# Patient Record
Sex: Female | Born: 1985 | Race: White | Hispanic: No | Marital: Married
Health system: Midwestern US, Community
[De-identification: ages and names within clinical notes are randomized; demographics above are authoritative.]

---
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
## 2007-08-20 ENCOUNTER — Emergency Department (HOSPITAL_COMMUNITY): Admission: EM | Admit: 2007-08-20 | Discharge: 2007-08-20 | Payer: Self-pay | Admitting: Emergency Medicine

## 2009-06-18 IMAGING — CT CT HEAD W/O CM
1 series · 16 of 30 positions shown, 20 images · IV contrast (agent unspecified)
Comparison: None.

CLINICAL DATA: 21-year-old female with lightheadedness and nausea.  Fall. 
 HEAD CT WITHOUT CONTRAST:
TECHNIQUE: Contiguous axial images were obtained from the base of the skull through the vertex according to standard protocol without contrast.

[Series 2: head_seq 4.5 h37s st · axial · 0.43mm/px · z∈[-138,-12]mm · 16 of 32 slices shown, 20 images]
[im 2/32  brain]
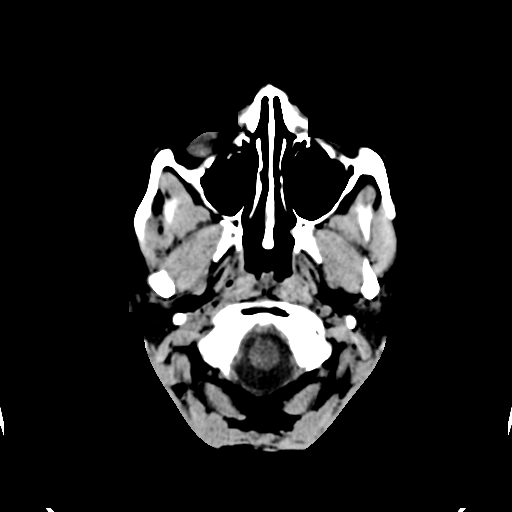
[im 2/32  bone]
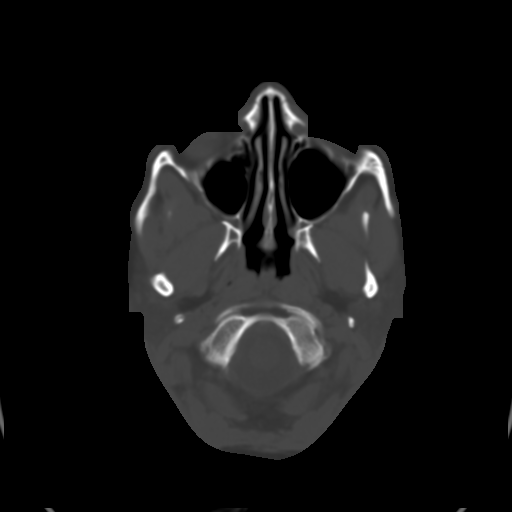
[im 4/32  brain]
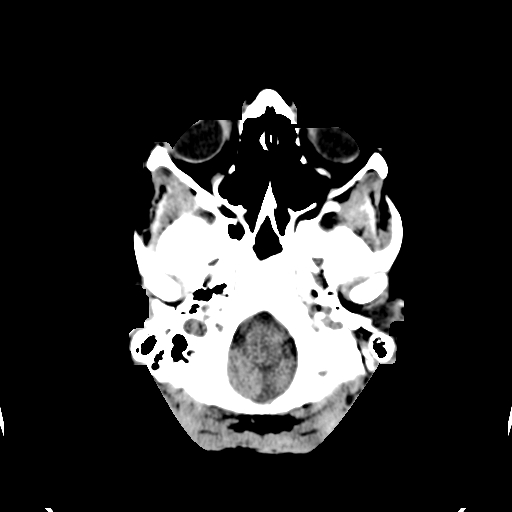
[im 6/32  brain]
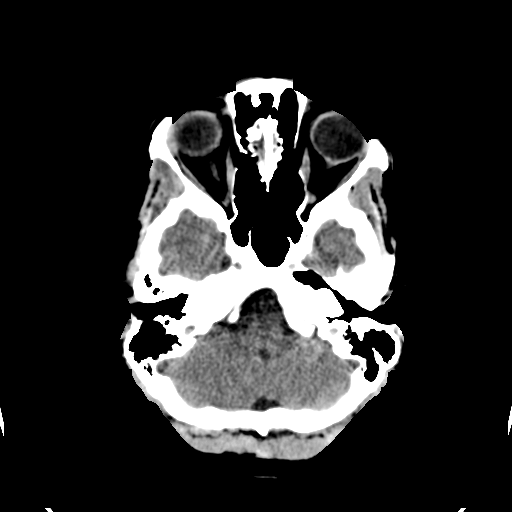
[im 8/32  brain]
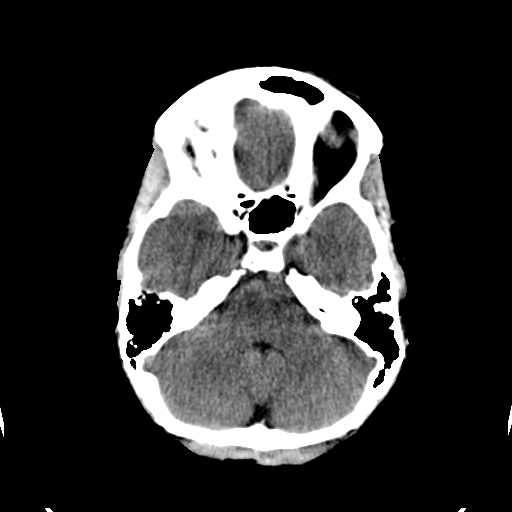
[im 9/32  brain]
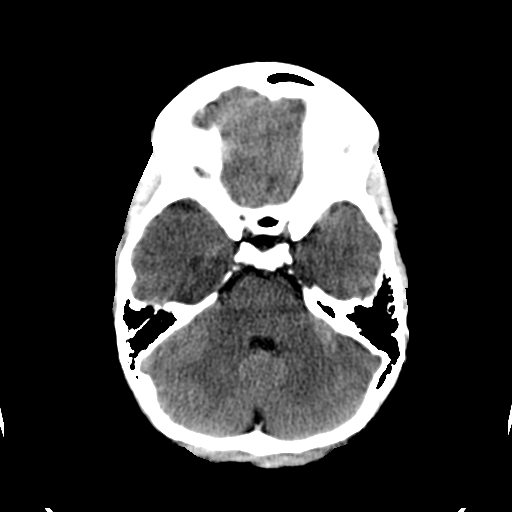
[im 9/32  bone]
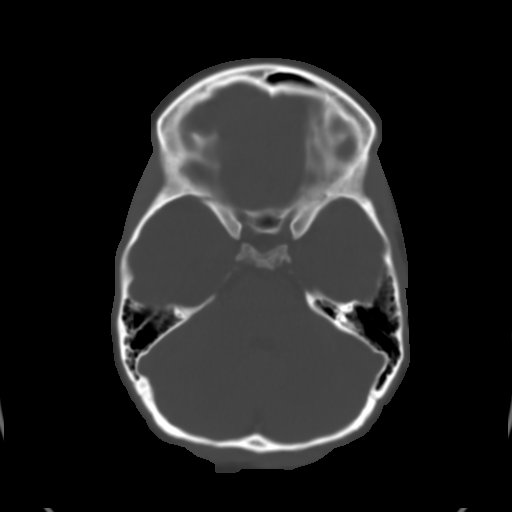
[im 11/32  brain]
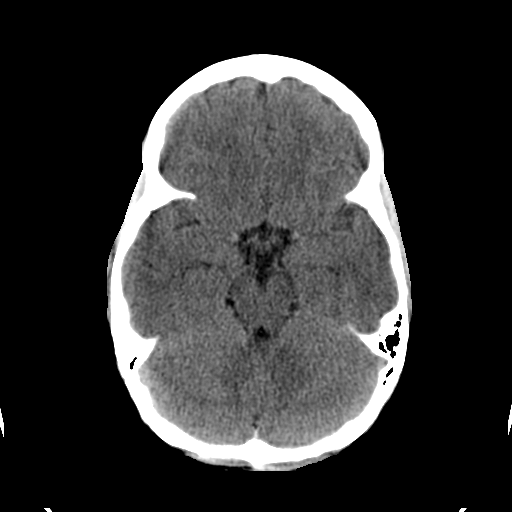
[im 13/32  brain]
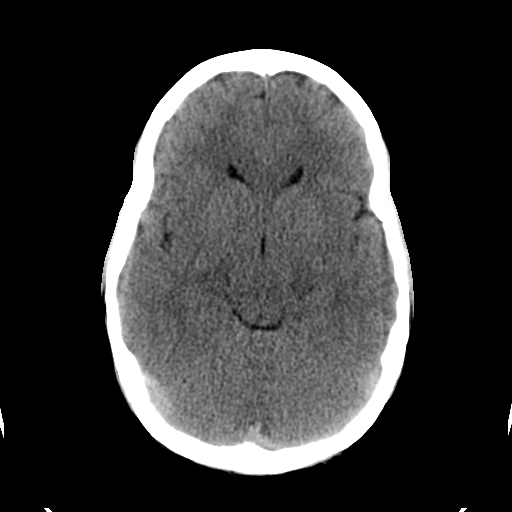
[im 15/32  brain]
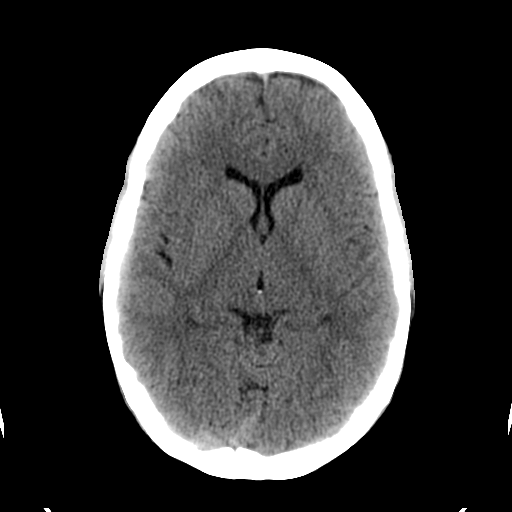
[im 17/32  brain]
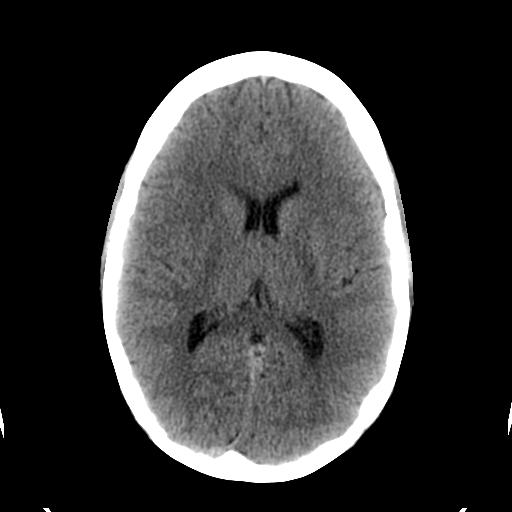
[im 17/32  bone]
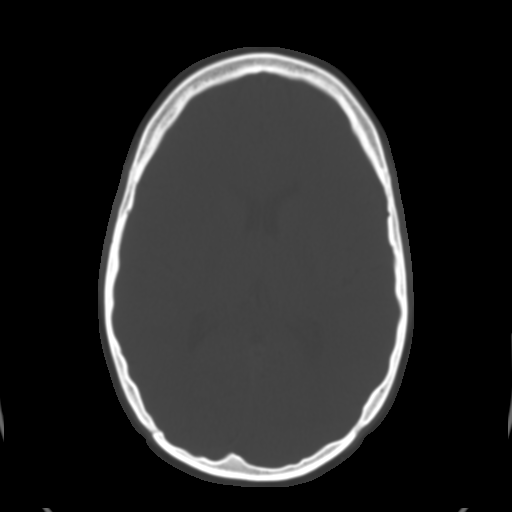
[im 19/32  brain]
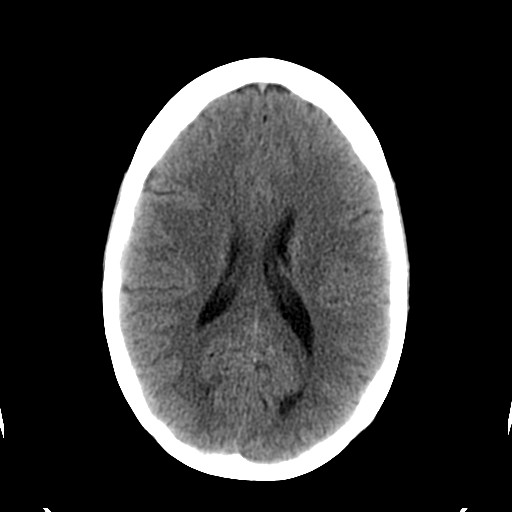
[im 21/32  brain]
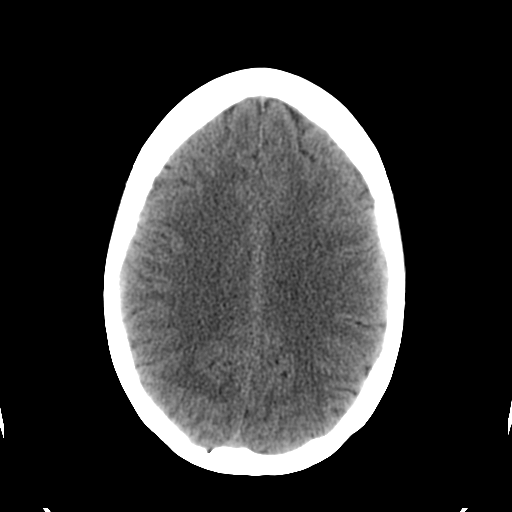
[im 23/32  brain]
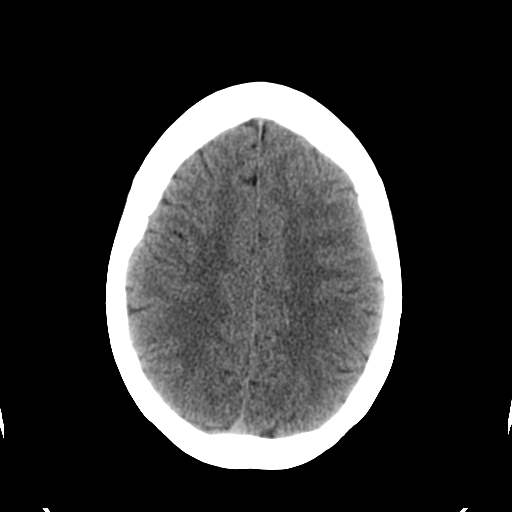
[im 24/32  brain]
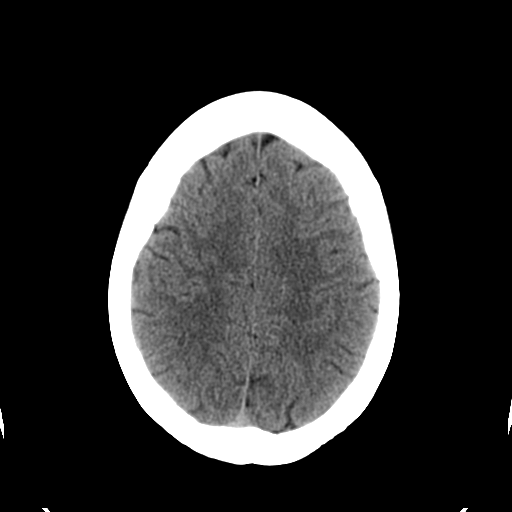
[im 24/32  bone]
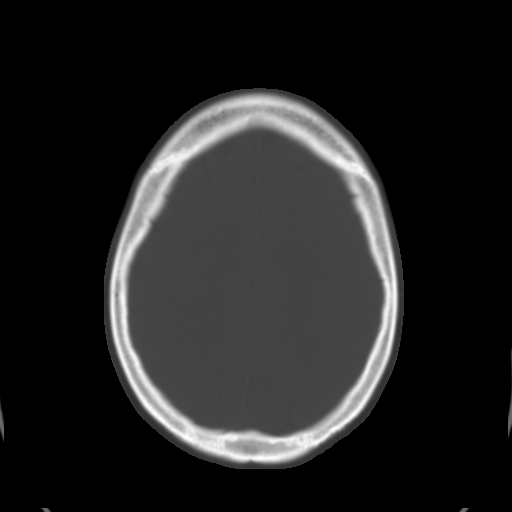
[im 26/32  brain]
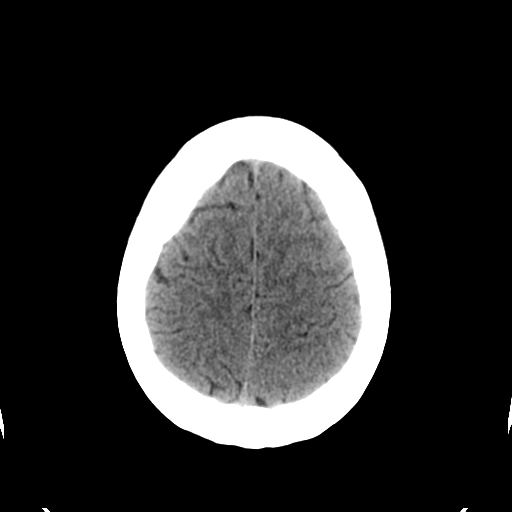
[im 28/32  brain]
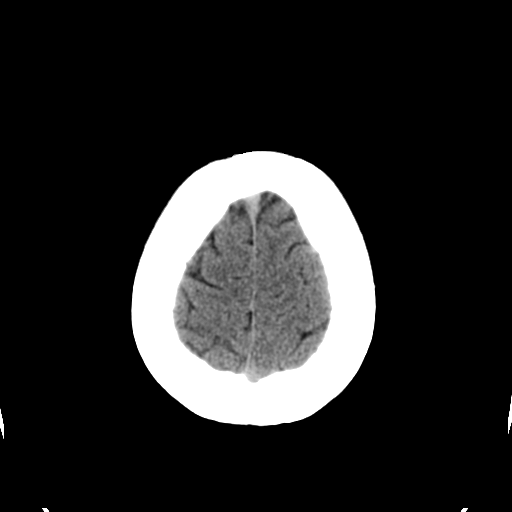
[im 30/32  brain]
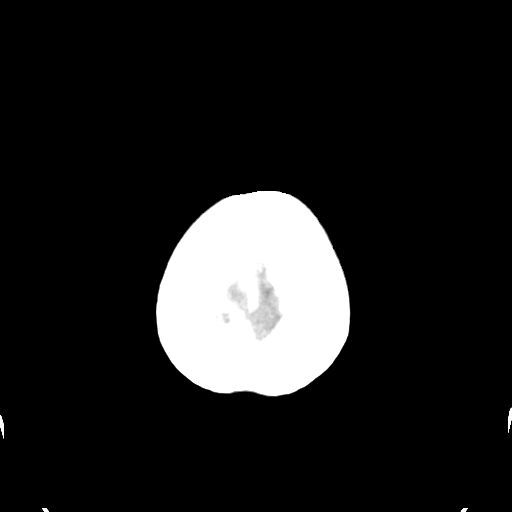

[16 of 30 positions shown; findings below may reference images not displayed]

FINDINGS: There is no evidence of intracranial hemorrhage, brain edema, or mass effect.  No other intra-axial abnormalities are seen, and the ventricles are within normal limits.  No abnormal extra-axial fluid collections or masses are identified.  No skull abnormalities are noted.
IMPRESSION: Negative non-contrast head CT.

## 2011-06-08 LAB — BASIC METABOLIC PANEL
Calcium: 9.5
Creatinine, Ser: 0.78
GFR calc non Af Amer: >60
Glucose, Bld: 104 — ABNORMAL HIGH
Potassium: 4.6
Sodium: 143

## 2011-06-08 LAB — CBC
MCHC: 35.1
RBC: 4.62
RDW: 12.5
WBC: 9.7

## 2011-06-08 LAB — RAPID URINE DRUG SCREEN, HOSP PERFORMED
Cocaine: NOT DETECTED
Opiates: NOT DETECTED

## 2011-06-08 LAB — POCT PREGNANCY, URINE: Operator id: 30260

## 2018-03-25 ENCOUNTER — Encounter (HOSPITAL_COMMUNITY): Payer: Self-pay | Admitting: Emergency Medicine

## 2018-03-25 ENCOUNTER — Emergency Department (HOSPITAL_COMMUNITY)
Admission: EM | Admit: 2018-03-25 | Discharge: 2018-03-26 | Disposition: A | Discharge status: Home / Self Care | Payer: 59 | Attending: Emergency Medicine | Admitting: Emergency Medicine

## 2018-03-25 DIAGNOSIS — Z91040 Latex allergy status: Secondary | ICD-10-CM | POA: Insufficient documentation

## 2018-03-25 DIAGNOSIS — R102 Pelvic and perineal pain: Secondary | ICD-10-CM | POA: Insufficient documentation

## 2018-03-25 LAB — URINALYSIS, ROUTINE W REFLEX MICROSCOPIC
BILIRUBIN URINE: NEGATIVE
Glucose, UA: NEGATIVE mg/dL
Hgb urine dipstick: NEGATIVE
KETONES UR: NEGATIVE mg/dL
LEUKOCYTES UA: NEGATIVE
NITRITE: NEGATIVE
PROTEIN: NEGATIVE mg/dL
Specific Gravity, Urine: 1.002 — ABNORMAL LOW (ref 1.005–1.030)
pH: 7 (ref 5.0–8.0)

## 2018-03-25 LAB — I-STAT BETA HCG BLOOD, ED (MC, WL, AP ONLY)

## 2018-03-25 LAB — CBC WITH DIFFERENTIAL/PLATELET
ABS IMMATURE GRANULOCYTES: 0 10*3/uL (ref 0.0–0.1)
Basophils Absolute: 0.1 10*3/uL (ref 0.0–0.1)
Basophils Relative: 1 %
EOS ABS: 0.3 10*3/uL (ref 0.0–0.7)
Eosinophils Relative: 4 %
HEMATOCRIT: 42.3 % (ref 36.0–46.0)
Hemoglobin: 13.9 g/dL (ref 12.0–15.0)
Immature Granulocytes: 0 %
LYMPHS ABS: 1.4 10*3/uL (ref 0.7–4.0)
LYMPHS PCT: 19 %
MCH: 28.1 pg (ref 26.0–34.0)
MCHC: 32.9 g/dL (ref 30.0–36.0)
MCV: 85.6 fL (ref 78.0–100.0)
MONOS PCT: 8 %
Monocytes Absolute: 0.6 10*3/uL (ref 0.1–1.0)
NEUTROS ABS: 5 10*3/uL (ref 1.7–7.7)
NEUTROS PCT: 68 %
Platelets: 200 10*3/uL (ref 150–400)
RBC: 4.94 MIL/uL (ref 3.87–5.11)
RDW: 12.1 % (ref 11.5–15.5)
WBC: 7.3 10*3/uL (ref 4.0–10.5)

## 2018-03-25 LAB — WET PREP, GENITAL
Clue Cells Wet Prep HPF POC: NONE SEEN
Sperm: NONE SEEN
Trich, Wet Prep: NONE SEEN
YEAST WET PREP: NONE SEEN

## 2018-03-25 LAB — COMPREHENSIVE METABOLIC PANEL
ALBUMIN: 4.6 g/dL (ref 3.5–5.0)
ALT: 20 U/L (ref 0–44)
AST: 21 U/L (ref 15–41)
Alkaline Phosphatase: 71 U/L (ref 38–126)
Anion gap: 12 (ref 5–15)
BILIRUBIN TOTAL: 0.8 mg/dL (ref 0.3–1.2)
BUN: 12 mg/dL (ref 6–20)
CALCIUM: 9.8 mg/dL (ref 8.9–10.3)
CO2: 26 mmol/L (ref 22–32)
Chloride: 104 mmol/L (ref 98–111)
Creatinine, Ser: 0.89 mg/dL (ref 0.44–1.00)
GFR calc Af Amer: >60 mL/min (ref >60)
GFR calc non Af Amer: >60 mL/min (ref >60)
Glucose, Bld: 114 mg/dL — ABNORMAL HIGH (ref 70–99)
Potassium: 4 mmol/L (ref 3.5–5.1)
Sodium: 142 mmol/L (ref 135–145)
Total Protein: 7.8 g/dL (ref 6.5–8.1)

## 2018-03-25 NOTE — ED Triage Notes (Signed)
Formatting of this note might be different from the original.  Pt presents with RLQ abd pain that began today at 3p and subsided and returned at 6p but worse; pt reports the pain radiates down into the R groin; LMP yesterday; pt denies urinary changes or vaginal discharge  Electronically signed by Lennie OdorWallace, Sara M, RN at 03/25/2018  7:31 PM EDT

## 2018-03-25 NOTE — ED Provider Notes (Signed)
Formatting of this note might be different from the original.  Patient placed in Quick Look pathway, seen and evaluated     Chief Complaint: right lower abd pain    HPI:   Pt on menses.  Severe pain rlq sudden onset.     ROS: no fever, no nausea    Physical Exam:    Gen: No distress   Neuro: Awake and Alert   Skin: Warm     Focused Exam: tender right lower quadrant    Initiation of care has begun. The patient has been counseled on the process, plan, and necessity for staying for the completion/evaluation, and the remainder of the medical screening examination    Osie CheeksSofia, Leslie K, PA-C  03/25/18 1938      Tegeler, Canary Brimhristopher J, MD  03/26/18 [phone]    Electronically signed by Tegeler, Canary Brimhristopher J, MD at 03/26/2018  1:19 AM EDT    Associated attestation - Tegeler, Canary Brimhristopher J, MD - 03/26/2018  1:19 AM EDT  Formatting of this note might be different from the original.  Attestation: Medical screening examination/treatment/procedure(s) were performed by non-physician practitioner and as supervising physician I was immediately available for consultation/collaboration.    EKG: None

## 2018-03-25 NOTE — ED Provider Notes (Signed)
Formatting of this note is different from the original.  Images from the original note were not included.    [facility]  Provider Note    CSN: [id number]  Arrival date & time: 03/25/18  1900    History    Chief Complaint  Chief Complaint   Patient presents with   ? Abdominal Pain     HPI  Amanda Mcknight is a 32 y.o. female.    The history is provided by the patient and medical records.   Abdominal Pain          32 year old female with history of cervical dysplasia with mild precancerous changes, presenting to the ED for right-sided pelvic pain.  Reports this began around 3:00 today lasted for about an hour and then resolved.  States she change her clothes and was actually going to work out around 6 PM when pain returned and was more severe than previous.  Pain localized to right pelvis/groin without significant radiation.  She denies any urinary symptoms, vaginal discharge.  No N/V/D.  She is currently on her menstrual cycle, normal period.  She has not had any new sexual partner, no clinical concern for STD.  She does follow-up with her OB/GYN every 6 months, reports her prior precancerous changes were on her left side.  She has no history of ovarian cyst, uterine fibroids, endometriosis, or other GYN pathology.    History reviewed. No pertinent past medical history.    There are no active problems to display for this patient.    History reviewed. No pertinent surgical history.     OB History    None      Home Medications      Prior to Admission medications    Not on File     Family History  History reviewed. No pertinent family history.    Social History  Social History     Tobacco Use   ? Smoking status: Never Smoker   Substance Use Topics   ? Alcohol use: Not Currently   ? Drug use: Not Currently     Allergies    Amoxicillin and Latex    Review of Systems  Review of Systems   Gastrointestinal: Positive for abdominal pain.   Genitourinary: Positive for pelvic pain.   All  other systems reviewed and are negative.    Physical Exam  Updated Vital Signs  BP (!) 122/91 (BP Location: Left Arm)   Pulse 76   Temp 97.8 F (36.6 C)   Resp 16   Ht 5\' 4"  (1.626 m)   Wt 72.6 kg (160 lb)   LMP 03/24/2018   SpO2 99%   BMI 27.46 kg/m     Physical Exam   Constitutional: She is oriented to person, place, and time. She appears well-developed and well-nourished.   HENT:   Head: Normocephalic and atraumatic.   Mouth/Throat: Oropharynx is clear and moist.   Eyes: Pupils are equal, round, and reactive to light. Conjunctivae and EOM are normal.   Neck: Normal range of motion.   Cardiovascular: Normal rate, regular rhythm and normal heart sounds.   Pulmonary/Chest: Effort normal and breath sounds normal.   Abdominal: Soft. Bowel sounds are normal. There is tenderness. There is no rigidity, no guarding and no tenderness at McBurney's point.     Genitourinary:   Genitourinary Comments: Exam chaperoned by RN  Normal female external genitalia without visible lesions/rash; very small amount of vaginal bleeding without appreciable discharge; right adnexal  tenderness; left non-tender; no CMT   Musculoskeletal: Normal range of motion.   Neurological: She is alert and oriented to person, place, and time.   Skin: Skin is warm and dry.   Psychiatric: She has a normal mood and affect.   Nursing note and vitals reviewed.    ED Treatments / Results   Labs  (all labs ordered are listed, but only abnormal results are displayed)  Labs Reviewed   WET PREP, GENITAL - Abnormal; Notable for the following components:       Result Value    WBC, Wet Prep HPF POC RARE (*)     All other components within normal limits   COMPREHENSIVE METABOLIC PANEL - Abnormal; Notable for the following components:    Glucose, Bld 114 (*)     All other components within normal limits   URINALYSIS, ROUTINE W REFLEX MICROSCOPIC - Abnormal; Notable for the following components:    Color, Urine COLORLESS (*)     Specific Gravity, Urine 1.002  (*)     All other components within normal limits   CBC WITH DIFFERENTIAL/PLATELET   I-STAT BETA HCG BLOOD, ED ([facility], [facility], [facility] ONLY)   GC/CHLAMYDIA PROBE AMP ([facility]) NOT AT [facility]     EKG  None    Radiology  US Pelvis Transvanginal Non-ob (tv Only)    Result Date: 03/26/2018  CLINICAL DATA:  Right-sided pelvic pain since 3 p.m., worsening. EXAM: TRANSABDOMINAL AND TRANSVAGINAL ULTRASOUND OF PELVIS DOPPLER ULTRASOUND OF OVARIES TECHNIQUE: Both transabdominal and transvaginal ultrasound examinations of the pelvis were performed. Transabdominal technique was performed for global imaging of the pelvis including uterus, ovaries, adnexal regions, and pelvic cul-de-sac. It was necessary to proceed with endovaginal exam following the transabdominal exam to visualize the ovaries and endometrium. Color and duplex Doppler ultrasound was utilized to evaluate blood flow to the ovaries. COMPARISON:  None. FINDINGS: Uterus Measurements: 6.5 x 3.6 x 4.9 cm. Uterus is retroverted. Small nabothian cysts in the cervix. No fibroids or other mass visualized. Endometrium Thickness: 5 mm.  No focal abnormality visualized. Right ovary Measurements: 3.3 x 1.8 x 2.4 cm. Normal appearance/no adnexal mass. Left ovary Measurements: 2.6 x 1.2 x 1.8 cm. Normal appearance/no adnexal mass. Pulsed Doppler evaluation of both ovaries demonstrates normal low-resistance arterial and venous waveforms. Other findings No abnormal free fluid. IMPRESSION: Normal ultrasound appearance of the uterus and ovaries. Electronically Signed   By: Burman Nieves M.D.   On: 03/26/2018 01:53     US Pelvis Complete    Result Date: 03/26/2018  CLINICAL DATA:  Right-sided pelvic pain since 3 p.m., worsening. EXAM: TRANSABDOMINAL AND TRANSVAGINAL ULTRASOUND OF PELVIS DOPPLER ULTRASOUND OF OVARIES TECHNIQUE: Both transabdominal and transvaginal ultrasound examinations of the pelvis were performed. Transabdominal technique was performed for global imaging of the pelvis  including uterus, ovaries, adnexal regions, and pelvic cul-de-sac. It was necessary to proceed with endovaginal exam following the transabdominal exam to visualize the ovaries and endometrium. Color and duplex Doppler ultrasound was utilized to evaluate blood flow to the ovaries. COMPARISON:  None. FINDINGS: Uterus Measurements: 6.5 x 3.6 x 4.9 cm. Uterus is retroverted. Small nabothian cysts in the cervix. No fibroids or other mass visualized. Endometrium Thickness: 5 mm.  No focal abnormality visualized. Right ovary Measurements: 3.3 x 1.8 x 2.4 cm. Normal appearance/no adnexal mass. Left ovary Measurements: 2.6 x 1.2 x 1.8 cm. Normal appearance/no adnexal mass. Pulsed Doppler evaluation of both ovaries demonstrates normal low-resistance arterial and venous waveforms. Other findings No abnormal  free fluid. IMPRESSION: Normal ultrasound appearance of the uterus and ovaries. Electronically Signed   By: Burman Nieves M.D.   On: 03/26/2018 01:53     US Pelvic Doppler (torsion R/o Or Mass Arterial Flow)    Result Date: 03/26/2018  CLINICAL DATA:  Right-sided pelvic pain since 3 p.m., worsening. EXAM: TRANSABDOMINAL AND TRANSVAGINAL ULTRASOUND OF PELVIS DOPPLER ULTRASOUND OF OVARIES TECHNIQUE: Both transabdominal and transvaginal ultrasound examinations of the pelvis were performed. Transabdominal technique was performed for global imaging of the pelvis including uterus, ovaries, adnexal regions, and pelvic cul-de-sac. It was necessary to proceed with endovaginal exam following the transabdominal exam to visualize the ovaries and endometrium. Color and duplex Doppler ultrasound was utilized to evaluate blood flow to the ovaries. COMPARISON:  None. FINDINGS: Uterus Measurements: 6.5 x 3.6 x 4.9 cm. Uterus is retroverted. Small nabothian cysts in the cervix. No fibroids or other mass visualized. Endometrium Thickness: 5 mm.  No focal abnormality visualized. Right ovary Measurements: 3.3 x 1.8 x 2.4 cm. Normal  appearance/no adnexal mass. Left ovary Measurements: 2.6 x 1.2 x 1.8 cm. Normal appearance/no adnexal mass. Pulsed Doppler evaluation of both ovaries demonstrates normal low-resistance arterial and venous waveforms. Other findings No abnormal free fluid. IMPRESSION: Normal ultrasound appearance of the uterus and ovaries. Electronically Signed   By: Burman Nieves M.D.   On: 03/26/2018 01:53     Procedures  Procedures (including critical care time)    Medications Ordered in ED  Medications - No data to display    Initial Impression / Assessment and Plan / ED Course   I have reviewed the triage vital signs and the nursing notes.    Pertinent labs & imaging results that were available during my care of the patient were reviewed by me and considered in my medical decision making (see chart for details).    32 year old female presenting to the ED with right-sided pelvic pain.  Reports this began around 3 PM, resolved but returned again around 6 PM.  She denies any associated nausea, vomiting, diarrhea, or fever.  No urinary symptoms, vaginal discharge, new sexual partner, or concern for STD.  Does have history of cervical dysplasia with precancerous changes.  Labs overall reassuring.  UA without any signs of infection.  Pelvic exam performed, moderate amount of vaginal bleeding as patient is currently on her menstrual.  There is no appreciable discharge.  Does have some right adnexal tenderness, exam otherwise benign.  Ultrasound obtained, no acute findings.  Wet prep negative.  Gc/chl pending. Uncertain etiology of patient's pain.  Remains localized to the right side of pelvis only, do not feel that this represents appendicitis.  Will have her follow-up closely with her GYN.  Will provide pain control.  She understands to return here for any new or worsening symptoms.    Final Clinical Impressions(s) / ED Diagnoses     Final diagnoses:   Pelvic pain     ED Discharge Orders         Ordered     ibuprofen (ADVIL,MOTRIN)  800 MG tablet  3 times daily      03/26/18 0247     HYDROcodone-acetaminophen (NORCO/VICODIN) 5-325 MG tablet  Every 4 hours PRN      03/26/18 0247         Garlon Hatchet, PA-C  03/26/18 0309      Wilkie Aye, Mayer Masker, MD  03/26/18 [phone]    Electronically signed by Shon Baton, MD at 03/26/2018  7:14 AM EDT  Associated attestation - Shon Baton, MD - 03/26/2018  7:14 AM EDT  Formatting of this note might be different from the original.  Attestation: Medical screening examination/treatment/procedure(s) were performed by non-physician practitioner and as supervising physician I was immediately available for consultation/collaboration.    EKG: None

## 2018-03-25 NOTE — ED Provider Notes (Signed)
[facility] Provider [facility]   CSN: [id number] Arrival date & time: 03/25/18  1900     History   Chief Complaint Chief Complaint  Patient presents with  . Abdominal Pain    HPI Joan Barrett is a 32 y.o. female.  The history is provided by the patient and medical records.  Abdominal Pain       32 year old female with history of cervical dysplasia with mild precancerous changes, presenting to the ED for right-sided pelvic pain.  Reports this began around 3:00 today lasted for about an hour and then resolved.  States she change her clothes and was actually going to work out around 6 PM when pain returned and was more severe than previous.  Pain localized to right pelvis/groin without significant radiation.  She denies any urinary symptoms, vaginal discharge.  No N/V/D.  She is currently on her menstrual cycle, normal period.  She has not had any new sexual partner, no clinical concern for STD.  She does follow-up with her OB/GYN every 6 months, reports her prior precancerous changes were on her left side.  She has no history of ovarian cyst, uterine fibroids, endometriosis, or other GYN pathology.  History reviewed. No pertinent past medical history.  There are no active problems to display for this patient.   History reviewed. No pertinent surgical history.   OB History   None      Home Medications    Prior to Admission medications   Not on File    Family History History reviewed. No pertinent family history.  Social History Social History   Tobacco Use  . Smoking status: Never Smoker  Substance Use Topics  . Alcohol use: Not Currently  . Drug use: Not Currently     Allergies   Amoxicillin and Latex   Review of Systems Review of Systems  Gastrointestinal: Positive for abdominal pain.  Genitourinary: Positive for pelvic pain.  All other systems reviewed and are negative.    Physical Exam Updated Vital  Signs BP (!) 122/91 (BP Location: Left Arm)   Pulse 76   Temp 97.8 F (36.6 C)   Resp 16   Ht 5\' 4"  (1.626 m)   Wt 72.6 kg (160 lb)   LMP 03/24/2018   SpO2 99%   BMI 27.46 kg/m   Physical Exam  Constitutional: She is oriented to person, place, and time. She appears well-developed and well-nourished.  HENT:  Head: Normocephalic and atraumatic.  Mouth/Throat: Oropharynx is clear and moist.  Eyes: Pupils are equal, round, and reactive to light. Conjunctivae and EOM are normal.  Neck: Normal range of motion.  Cardiovascular: Normal rate, regular rhythm and normal heart sounds.  Pulmonary/Chest: Effort normal and breath sounds normal.  Abdominal: Soft. Bowel sounds are normal. There is tenderness. There is no rigidity, no guarding and no tenderness at McBurney's point.    Genitourinary:  Genitourinary Comments: Exam chaperoned by RN Normal female external genitalia without visible lesions/rash; very small amount of vaginal bleeding without appreciable discharge; right adnexal tenderness; left non-tender; no CMT  Musculoskeletal: Normal range of motion.  Neurological: She is alert and oriented to person, place, and time.  Skin: Skin is warm and dry.  Psychiatric: She has a normal mood and affect.  Nursing note and vitals reviewed.    ED Treatments / Results  Labs (all labs ordered are listed, but only abnormal results are displayed) Labs Reviewed  WET PREP, GENITAL - Abnormal; Notable for the following components:  Result Value   WBC, Wet Prep HPF POC RARE (*)    All other components within normal limits  COMPREHENSIVE METABOLIC PANEL - Abnormal; Notable for the following components:   Glucose, Bld 114 (*)    All other components within normal limits  URINALYSIS, ROUTINE W REFLEX MICROSCOPIC - Abnormal; Notable for the following components:   Color, Urine COLORLESS (*)    Specific Gravity, Urine 1.002 (*)    All other components within normal limits  CBC WITH  DIFFERENTIAL/PLATELET  I-STAT BETA HCG BLOOD, ED ([facility], [facility], [facility] ONLY)  GC/CHLAMYDIA PROBE AMP ([facility]) NOT AT [facility]    EKG None  Radiology US Pelvis Transvanginal Non-ob (tv Only)  Result Date: 03/26/2018 CLINICAL DATA:  Right-sided pelvic pain since 3 p.m., worsening. EXAM: TRANSABDOMINAL AND TRANSVAGINAL ULTRASOUND OF PELVIS DOPPLER ULTRASOUND OF OVARIES TECHNIQUE: Both transabdominal and transvaginal ultrasound examinations of the pelvis were performed. Transabdominal technique was performed for global imaging of the pelvis including uterus, ovaries, adnexal regions, and pelvic cul-de-sac. It was necessary to proceed with endovaginal exam following the transabdominal exam to visualize the ovaries and endometrium. Color and duplex Doppler ultrasound was utilized to evaluate blood flow to the ovaries. COMPARISON:  None. FINDINGS: Uterus Measurements: 6.5 x 3.6 x 4.9 cm. Uterus is retroverted. Small nabothian cysts in the cervix. No fibroids or other mass visualized. Endometrium Thickness: 5 mm.  No focal abnormality visualized. Right ovary Measurements: 3.3 x 1.8 x 2.4 cm. Normal appearance/no adnexal mass. Left ovary Measurements: 2.6 x 1.2 x 1.8 cm. Normal appearance/no adnexal mass. Pulsed Doppler evaluation of both ovaries demonstrates normal low-resistance arterial and venous waveforms. Other findings No abnormal free fluid. IMPRESSION: Normal ultrasound appearance of the uterus and ovaries. Electronically Signed   By: Burman Nieves M.D.   On: 03/26/2018 01:53   US Pelvis Complete  Result Date: 03/26/2018 CLINICAL DATA:  Right-sided pelvic pain since 3 p.m., worsening. EXAM: TRANSABDOMINAL AND TRANSVAGINAL ULTRASOUND OF PELVIS DOPPLER ULTRASOUND OF OVARIES TECHNIQUE: Both transabdominal and transvaginal ultrasound examinations of the pelvis were performed. Transabdominal technique was performed for global imaging of the pelvis including uterus, ovaries, adnexal regions, and pelvic  cul-de-sac. It was necessary to proceed with endovaginal exam following the transabdominal exam to visualize the ovaries and endometrium. Color and duplex Doppler ultrasound was utilized to evaluate blood flow to the ovaries. COMPARISON:  None. FINDINGS: Uterus Measurements: 6.5 x 3.6 x 4.9 cm. Uterus is retroverted. Small nabothian cysts in the cervix. No fibroids or other mass visualized. Endometrium Thickness: 5 mm.  No focal abnormality visualized. Right ovary Measurements: 3.3 x 1.8 x 2.4 cm. Normal appearance/no adnexal mass. Left ovary Measurements: 2.6 x 1.2 x 1.8 cm. Normal appearance/no adnexal mass. Pulsed Doppler evaluation of both ovaries demonstrates normal low-resistance arterial and venous waveforms. Other findings No abnormal free fluid. IMPRESSION: Normal ultrasound appearance of the uterus and ovaries. Electronically Signed   By: Burman Nieves M.D.   On: 03/26/2018 01:53   US Pelvic Doppler (torsion R/o Or Mass Arterial Flow)  Result Date: 03/26/2018 CLINICAL DATA:  Right-sided pelvic pain since 3 p.m., worsening. EXAM: TRANSABDOMINAL AND TRANSVAGINAL ULTRASOUND OF PELVIS DOPPLER ULTRASOUND OF OVARIES TECHNIQUE: Both transabdominal and transvaginal ultrasound examinations of the pelvis were performed. Transabdominal technique was performed for global imaging of the pelvis including uterus, ovaries, adnexal regions, and pelvic cul-de-sac. It was necessary to proceed with endovaginal exam following the transabdominal exam to visualize the ovaries and endometrium. Color and duplex Doppler ultrasound was utilized to evaluate  blood flow to the ovaries. COMPARISON:  None. FINDINGS: Uterus Measurements: 6.5 x 3.6 x 4.9 cm. Uterus is retroverted. Small nabothian cysts in the cervix. No fibroids or other mass visualized. Endometrium Thickness: 5 mm.  No focal abnormality visualized. Right ovary Measurements: 3.3 x 1.8 x 2.4 cm. Normal appearance/no adnexal mass. Left ovary Measurements: 2.6 x 1.2 x  1.8 cm. Normal appearance/no adnexal mass. Pulsed Doppler evaluation of both ovaries demonstrates normal low-resistance arterial and venous waveforms. Other findings No abnormal free fluid. IMPRESSION: Normal ultrasound appearance of the uterus and ovaries. Electronically Signed   By: Burman Nieves M.D.   On: 03/26/2018 01:53    Procedures Procedures (including critical care time)  Medications Ordered in ED Medications - No data to display   Initial Impression / Assessment and Plan / ED Course  I have reviewed the triage vital signs and the nursing notes.  Pertinent labs & imaging results that were available during my care of the patient were reviewed by me and considered in my medical decision making (see chart for details).  32 year old female presenting to the ED with right-sided pelvic pain.  Reports this began around 3 PM, resolved but returned again around 6 PM.  She denies any associated nausea, vomiting, diarrhea, or fever.  No urinary symptoms, vaginal discharge, new sexual partner, or concern for STD.  Does have history of cervical dysplasia with precancerous changes.  Labs overall reassuring.  UA without any signs of infection.  Pelvic exam performed, moderate amount of vaginal bleeding as patient is currently on her menstrual.  There is no appreciable discharge.  Does have some right adnexal tenderness, exam otherwise benign.  Ultrasound obtained, no acute findings.  Wet prep negative.  Gc/chl pending. Uncertain etiology of patient's pain.  Remains localized to the right side of pelvis only, do not feel that this represents appendicitis.  Will have her follow-up closely with her GYN.  Will provide pain control.  She understands to return here for any new or worsening symptoms.  Final Clinical Impressions(s) / ED Diagnoses   Final diagnoses:  Pelvic pain    ED Discharge Orders        Ordered    ibuprofen (ADVIL,MOTRIN) 800 MG tablet  3 times daily     03/26/18 0247     HYDROcodone-acetaminophen (NORCO/VICODIN) 5-325 MG tablet  Every 4 hours PRN     03/26/18 0247       Garlon Hatchet, PA-C 03/26/18 0309    Wilkie Aye Mayer Masker, MD 03/26/18 [phone]

## 2018-03-25 NOTE — ED Triage Notes (Signed)
Pt presents with RLQ abd pain that began today at 3p and subsided and returned at 6p but worse; pt reports the pain radiates down into the R groin; LMP yesterday; pt denies urinary changes or vaginal discharge

## 2018-03-25 NOTE — ED Provider Notes (Signed)
Patient placed in Quick Look pathway, seen and evaluated   Chief Complaint: right lower abd pain  HPI:   Pt on menses.  Severe pain rlq sudden onset.   ROS: no fever, no nausea  Physical Exam:   Gen: No distress  Neuro: Awake and Alert  Skin: Warm    Focused Exam: tender right lower quadrant   Initiation of care has begun. The patient has been counseled on the process, plan, and necessity for staying for the completion/evaluation, and the remainder of the medical screening examination   Osie CheeksSofia, Leyton Brownlee K, PA-C 03/25/18 1938    Tegeler, Canary Brimhristopher J, MD 03/26/18 [phone]

## 2018-03-26 ENCOUNTER — Emergency Department (HOSPITAL_COMMUNITY): Payer: 59

## 2018-03-26 LAB — GC/CHLAMYDIA PROBE AMP (~~LOC~~) NOT AT ARMC
CHLAMYDIA, DNA PROBE: NEGATIVE
NEISSERIA GONORRHEA: NEGATIVE

## 2018-03-26 MED ORDER — IBUPROFEN 800 MG PO TABS: 800.0000 mg | ORAL_TABLET | Freq: Three times a day (TID) | ORAL | 0 refills | Status: AC

## 2018-03-26 MED ORDER — IBUPROFEN 800 MG PO TABS
800.0000 mg | ORAL_TABLET | Freq: Once | ORAL | Status: AC
  Administered 2018-03-26: 800 mg via ORAL
  Filled 2018-03-26: qty 1

## 2018-03-26 MED ORDER — HYDROCODONE-ACETAMINOPHEN 5-325 MG PO TABS: 1.0000 | ORAL_TABLET | ORAL | 0 refills | Status: AC | PRN

## 2018-03-26 NOTE — Discharge Instructions (Signed)
Take the prescribed medication as directed. Follow-up with your OB-GYN-- call in the morning. Return to the ED for new or worsening symptoms.

## 2019-01-28 NOTE — Progress Notes (Signed)
Formatting of this note is different from the original.  Images from the original note were not included.      Primary care provider: Maudie FlakesShane D Anderson, Mcknight  Referring provider:      Maudie FlakesShane D Anderson, Mcknight    Assessment      1. Daytime hypersomnia    2. Cough    3. Snoring      Amanda Mcknight is a 33 year old female, nonsmoker with history of a resolving cough.  Her pulmonary function test did show some mild restriction but she does not have any significant respiratory complaints today.  She does snore loudly and has some daytime fatigue and is at risk for sleep apnea.  Her mom has sleep apnea.  She does agree to a sleep study and CPAP therapy if needed.   Plan      Split night sleep study   Continue Xyzal for nasal allergies    We discussed the diagnosis and treatment plan in detail and the patient expressed understanding.     Thank you for allowing us to participate in the care of this patient.        Subjective     Chief Complaint   Patient presents with   ? Sleep Disturbance & Cough     New patient referred by Amanda Mcknight     Patient ID:  Amanda Mcknight is a 33 y.o. female, nonsmoker presents for evaluation regarding a cough that began around last oct. She was seen last nov and the cough just started improving in the last 3 weeks. She has been on 3 rounds of antibiotics. She has been told recently that she is snoring that is very loud. She denies witnessed apneas. Her mom has sleep apnea. She does feel fatigued during the day. She does have nonrestorative sleep. Her PFT shows mild restriction. She does not wheeze. She does not use inhalers.     PULMONARY  The patient is not on home oxygen therapy.  She denies a history of blood clots.  She denies exposure to asbestos.  She denies exposure to tuberculosis.   She denies history of a positive TB skin test.   She denies a history of COPD.  She denies a history of asthma.  She denies a recent hospitalization.  She denies a prior history of lung cancer.   There  is no a prior history of lung surgery.   She denies a prior history pneumonia.   Occupational history: Recruiting    SLEEP  She denies a prior history of sleep apnea.She is not using cpap or bipap. She denies nonrestorative sleep. The patient denies excessive daytime sleepiness.   She does snore. The snoring is disruptive. She denies witnessed apneas.  She denies choking and gasping while sleeping. The patient has disturbed sleep.  She admits to morning headaches. The snoring does not wake the patient up from sleep. She denies a history of restless leg syndrome.  The patient denies leg movements in her sleep.  She does not act out dreams. She does not grind her teeth.   She denies sleep walking.  She does not have nightmares.      She denies sleep paralysis.  She denies hallucinations.  The patient denies cataplexy.     She does not work the second or third shift.     She goes to sleep at 11 PM and wakes up at 6:30 AM.   She sleeps for a total of about 5-7 hours.  It  take her less than 30 minutes to fall asleep.  She wakes up 0 time(s) during the night.  She wakes up due to the following: nothing.  The patient denies nocturia.   The patient denies unexplained arousals from sleep.    She drinks 2 caffeinated beverages a day.      She denies having a prior sleep study. The sleep study was not done at our facility.    The patient has a family member with sleep apnea. Mother   She has gained weight recently. The patient's neck size is 14 inches.    Epworth Sleep Questionnaire    How likely are you to doze off or fall asleep in the following situations?    Sitting and reading: 2 = moderate chance of dozing  Watching TV: 1 = slight chance of dozing  Sitting inactive in a public place (such as a theater or a meeting):  0 = no chance of dozing  As a passenger in a car for an hour without a break: 1 = slight chance of dozing  Lying down to rest in the afternoon when circumstances permit: 2 = moderate chance of  dozing  Sitting and talking to someone: 0 = no chance of dozing  Sitting quiety after a lunch without alcohol: 1 = slight chance of dozing  In a car, while stopped for a few minutes in traffic: 0 = no chance of dozing    Total Epworth: 7    Review of Systems   Constitutional: Positive for malaise/fatigue.   HENT: Positive for sore throat.    Eyes: Negative.    Respiratory: Positive for cough.    Cardiovascular: Negative.    Gastrointestinal: Negative.    Genitourinary: Negative.    Musculoskeletal: Negative.    Skin: Negative.    Neurological: Positive for headaches.   Endo/Heme/Allergies: Negative.    Psychiatric/Behavioral: Negative.    All other systems reviewed and are negative.    History     Past Medical History:   Diagnosis Date   ? Abnormal Pap smear of cervix    ? Herpes    ? Shingles 2015    back of right leg     Past Surgical History:   Procedure Laterality Date   ? Cervical biopsy  w/ loop electrode excision  2015   ? Colposcopy  2015     Social History     Socioeconomic History   ? Marital status: Single     Spouse name: Not on file   ? Number of children: 0   ? Years of education: 57   ? Highest education level: Not on file   Occupational History   ? Occupation: Recruiting   Social Needs   ? Financial resource strain: Not on file   ? Food insecurity     Worry: Not on file     Inability: Not on file   ? Transportation needs     Medical: Not on file     Non-medical: Not on file   Tobacco Use   ? Smoking status: Never Smoker   ? Smokeless tobacco: Never Used   Substance and Sexual Activity   ? Alcohol use: Yes     Comment: Occasional/rare   ? Drug use: No   ? Sexual activity: Yes     Partners: Male     Birth control/protection: Condom   Lifestyle   ? Physical activity     Days per week: Not on file  Minutes per session: Not on file   ? Stress: Not on file   Relationships   ? Social Wellsite geologist on phone: Not on file     Gets together: Not on file     Attends religious service: Not on file      Active member of club or organization: Not on file     Attends meetings of clubs or organizations: Not on file     Relationship status: Not on file   ? Intimate partner violence     Fear of current or ex partner: Not on file     Emotionally abused: Not on file     Physically abused: Not on file     Forced sexual activity: Not on file   Other Topics Concern   ? Not on file   Social History Narrative   ? Not on file     Family History   Problem Relation Age of Onset   ? Hypertension Mother    ? Cancer Father         prostate   ? Hyperlipidemia Father    ? Hypertension Father    ? Asthma Sister    ? Cancer Maternal Aunt    ? Depression Maternal Aunt    ? Cancer Paternal Aunt    ? Arthritis Maternal Grandmother    ? COPD Maternal Grandmother    ? Hypertension Maternal Grandmother    ? Hypertension Maternal Grandfather    ? Cancer Paternal Grandmother    ? Hypertension Paternal Grandmother    ? Breast cancer Paternal Grandmother    ? COPD Paternal Grandfather    ? Heart disease Paternal Grandfather    ? Hyperlipidemia Paternal Grandfather    ? Hypertension Paternal Grandfather    ? Colon cancer Neg Hx    ? Ovarian cancer Neg Hx    ? Diabetes Neg Hx      Medication History       Medication Sig Dispense Refill   ? ergocalciferol (VITAMIN D2) 50,000 units CAPS capsule Take one capsule (50,000 Units dose) by mouth once a week. 24 capsule 0   ? fluticasone propionate (FLONASE) 50 mcg/actuation nasal spray two sprays by Nasal route daily. 16 g 0   ? ibuprofen (ADVIL,MOTRIN) 800 mg tablet Take 800 mg by mouth every 8 (eight) hours as needed.     ? Prenatal MV & Min w/FA-DHA (PRENATAL ADULT GUMMY/DHA/FA) 0.4-25 MG CHEW Chew 1 tablet by mouth 2 (two) times a day as needed.     ? pseudoephedrine-guaifenesin (MUCINEX D) 60-600 MG per tablet Take one tablet by mouth every 12 (twelve) hours. 30 tablet 0   ? valacyclovir (VALTREX) 1000 mg tablet Take one tablet (1,000 mg dose) by mouth daily for 3 days. 3 tablet 1     No current  facility-administered medications for this visit.      Allergies   Allergen Reactions   ? Amoxicillin Swelling   ? Latex Rash         I reviewed the patient's medical,surgical,social and family history. The medications and allergies have been reviewed and updated.     Pulmonary Function Testing/Spirometry   No results found.  Radiology     CXR:   No results found for this or any previous visit.     CT Scan:  No results found for this or any previous visit.    No results found for this or any previous visit.  No results found for this or any previous visit.    No results found for this or any previous visit.    No results found for this or any previous visit.    No results found for this or any previous visit.    No results found for this or any previous visit.    No results found for this or any previous visit.    VQ Scan:  No results found for this or any previous visit.    TTE:    No results found for this or any previous visit.    Objective   BP 138/82 (BP Location: Left arm, Patient Position: Sitting)   Pulse 89   Ht 5\' 4"  (1.626 m)   Wt 167 lb (75.8 kg)   SpO2 97%   BMI 28.67 kg/m     General appearance:  alert, appears stated age and cooperative  Head:    normocephalic  Eyes:    pupils are equal, round and reactive  Nose:     normal  Mouth:    moist, no thrush, mallampati 4  Neck:    supple, no significant adenopathy, no thyromegaly, no JVD  Chest:    clear to auscultation, no wheezes, rales or rhonchi, symmetric air entry  Heart:    normal rate, regular rhythm, normal S1, S2, no murmurs, rubs, clicks or gallops  Abdomen:   soft, nontender, nondistended  Neurological:   alert, oriented  Extremities:   peripheral pulses normal,no pitting edema , no clubbing or cyanosis    The patient was given a copy of their after visit summary.    Voice-recognition software was used in [employer] of this documentation. Unintended transcription errors may have escaped editorial review.     Orders Placed This  Encounter   Procedures   ? Spirometry W Pre/Post Bronch, Lung Vol (N2), DLCO, Pulse CO-OX (HGB or CO)   ? Split Night Study     Immunizations     Name Date Dose VIS Date Route    Tdap 05/06/2015  2:59 PM 0.5 mL 10/27/2013 Intramuscular    Site: Left deltoid    Given By: Josephine Igo, LPN    Manufacturer: [employer]    Lot: [id number]         Patient's Medications        Accurate as of Jan 28, 2019  2:29 PM. Reflects encounter med changes as of last refresh         Continued Medications      Instructions   ergocalciferol 50,000 units Caps capsule  Commonly known as:  Vitamin D2   50,000 Units, Oral, Weekly    fluticasone propionate 50 mcg/actuation nasal spray  Commonly known as:  FLONASE   2 sprays, Nasal, Daily    ibuprofen 800 mg tablet  Commonly known as:  ADVIL,MOTRIN   800 mg, Oral, Every 8 hours as needed    Prenatal Adult Gummy/DHA/FA 0.4-25 MG Chew   1 tablet, Oral, 2 times a day as needed    pseudoephedrine-guaifenesin 60-600 MG per tablet  Commonly known as:  MUCINEX D   1 tablet, Oral, Every 12 hours    valacyclovir 1000 mg tablet  Commonly known as:  VALTREX   1,000 mg, Oral, Daily          Electronically signed by Randolm Idol, MD at 01/28/2019  2:36 PM EDT

## 2020-07-19 IMAGING — US US PELVIS COMPLETE
1 series · 13 of 25 positions shown · non-contrast
Comparison: None.

CLINICAL DATA: Right-sided pelvic pain since 3 p.m., worsening.



[Series 1: us pelvis complete · 0.24mm/px · 13 of 58 slices shown]
[im 1/58]
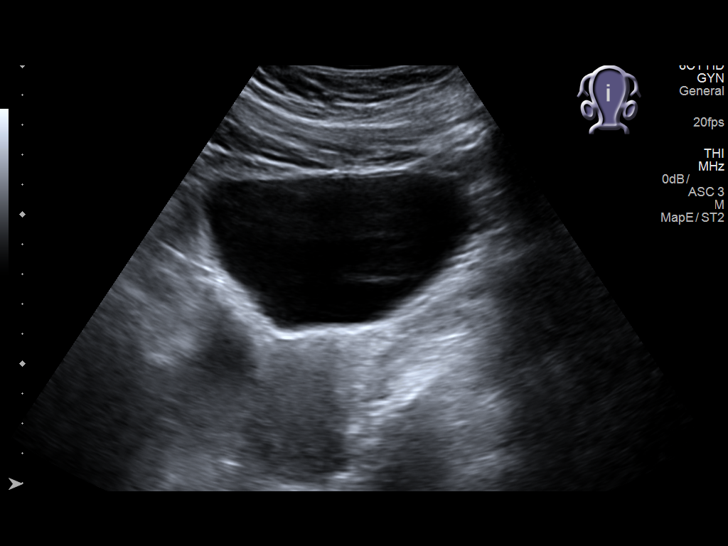
[im 5/58]
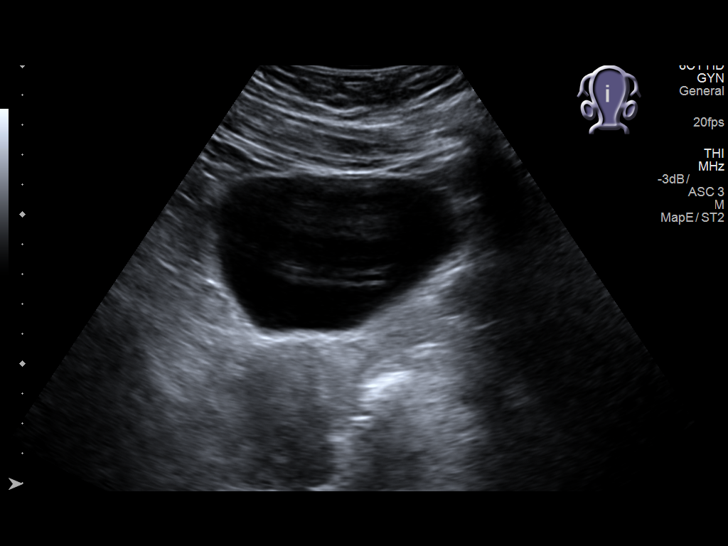
[im 10/58]
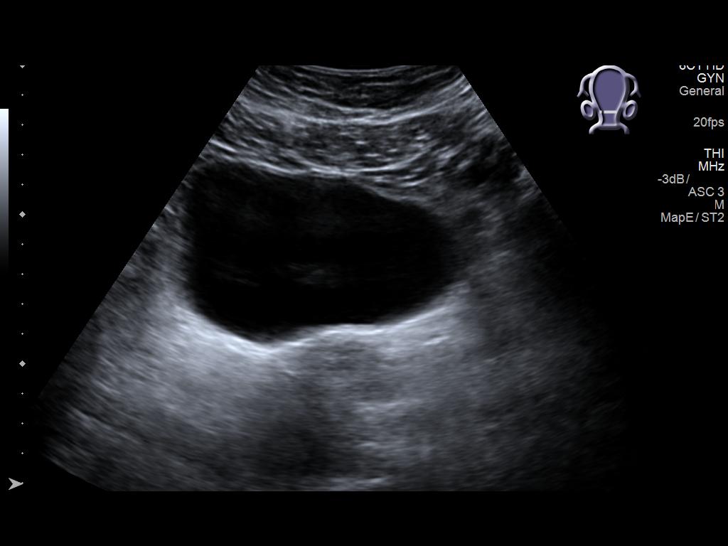
[im 15/58]
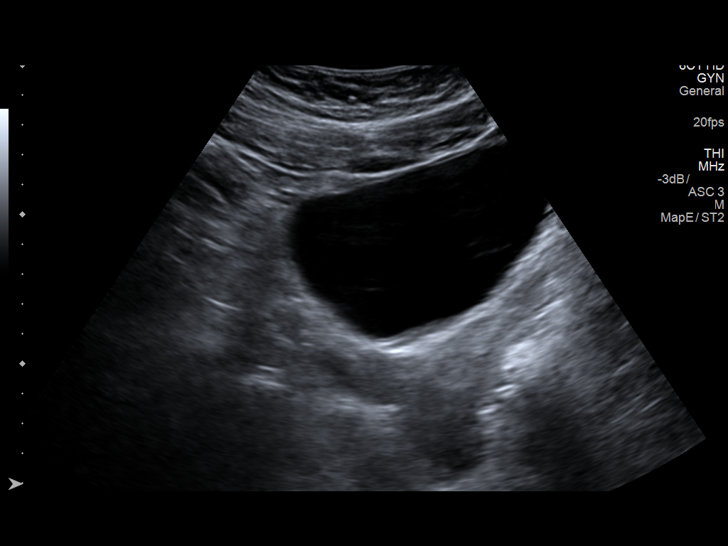
[im 20/58]
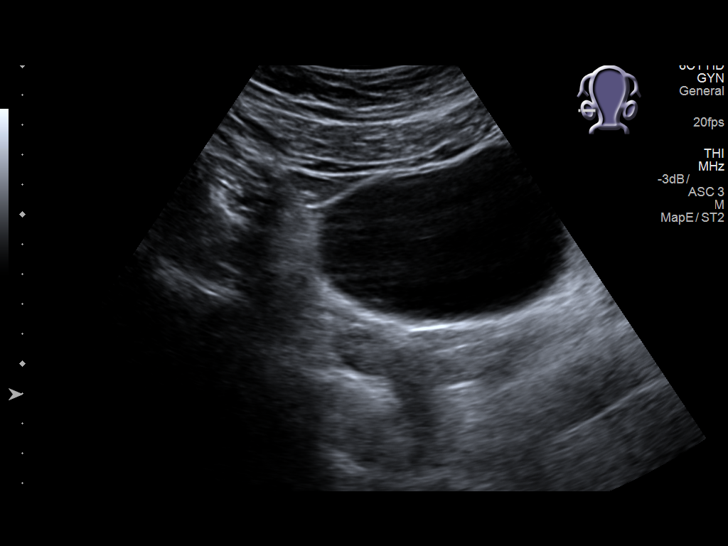
[im 24/58]
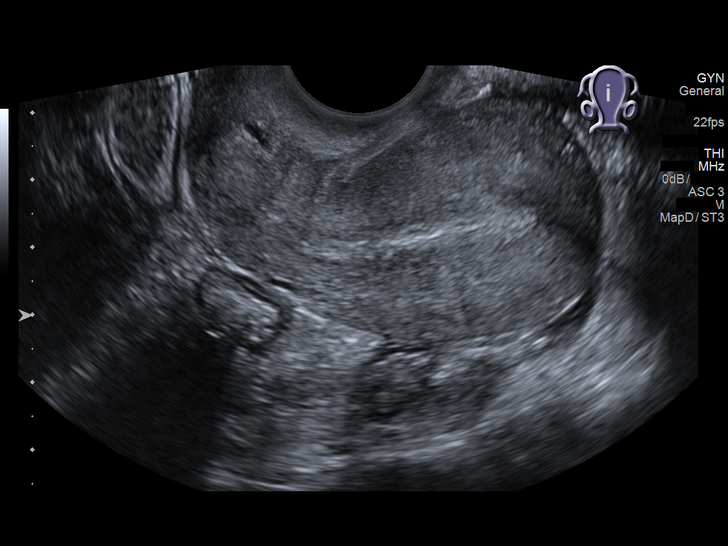
[im 29/58]
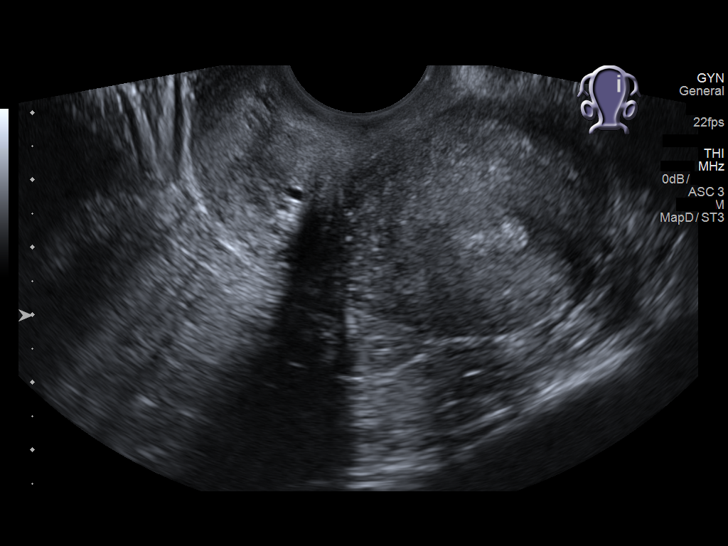
[im 34/58]
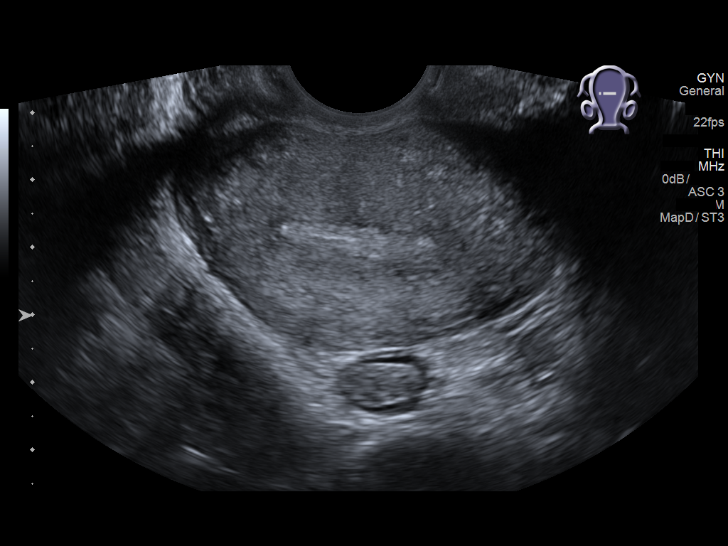
[im 39/58]
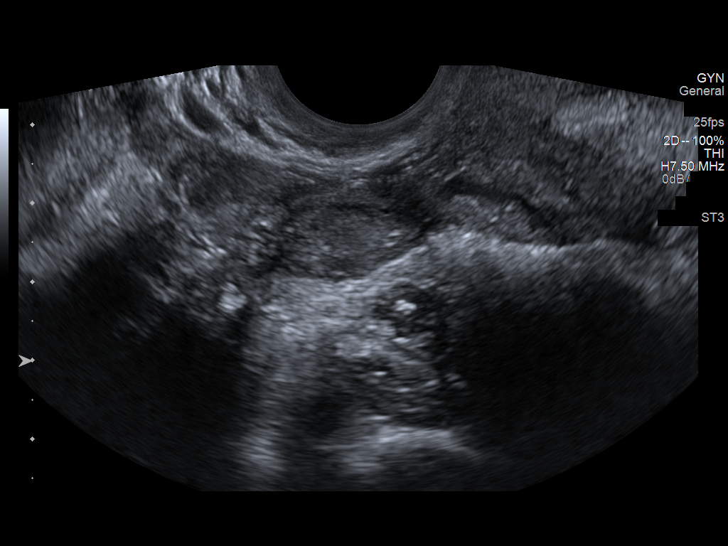
[im 43/58]
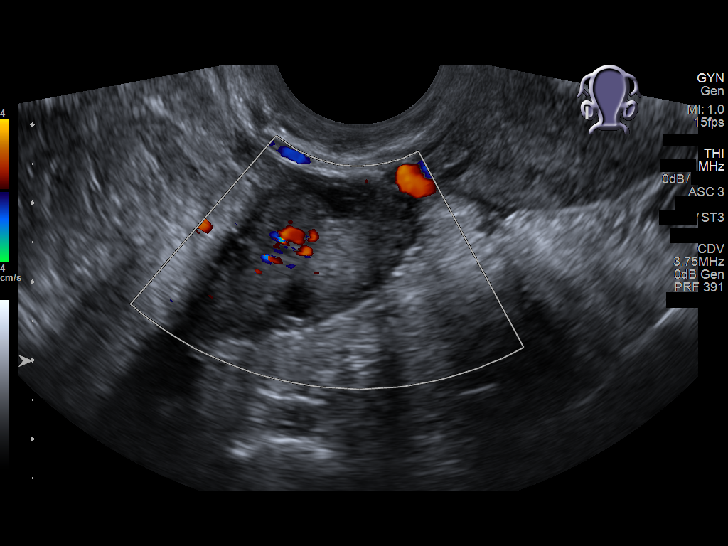
[im 48/58]
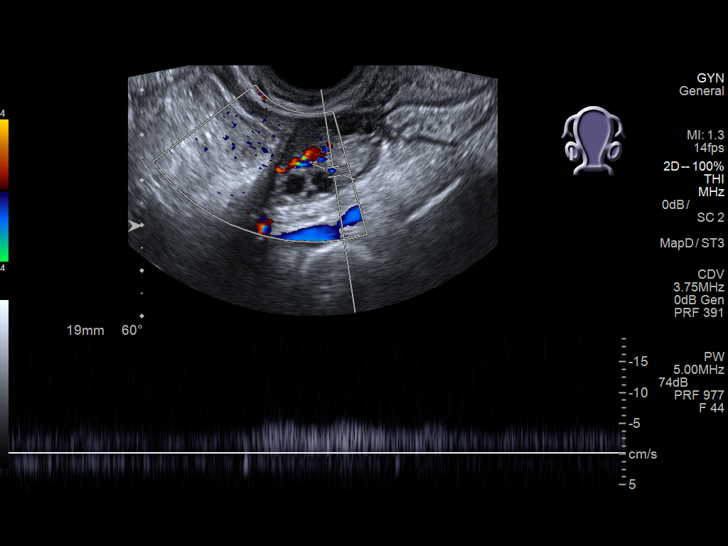
[im 53/58]
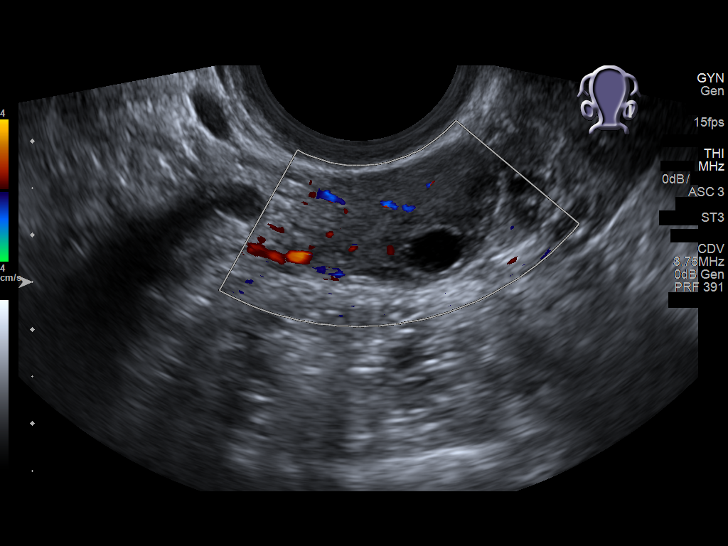
[im 58/58]
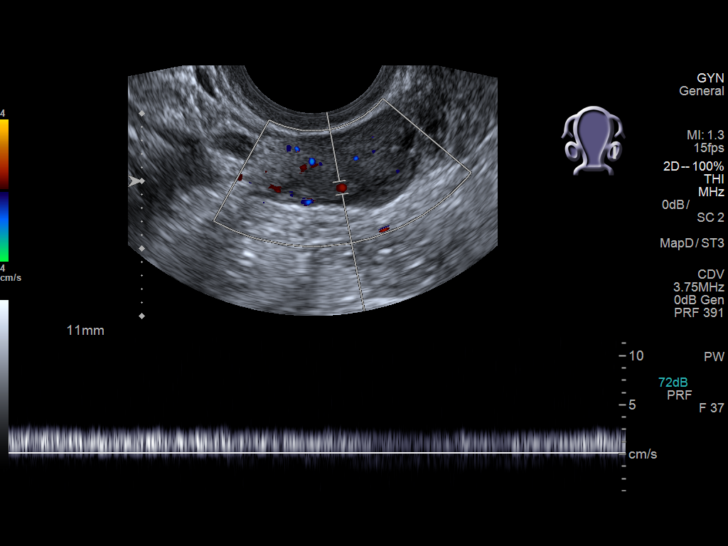

[13 of 25 positions shown; findings below may reference images not displayed]

FINDINGS: Uterus

Measurements: 6.5 x 3.6 x 4.9 cm. Uterus is retroverted. Small
nabothian cysts in the cervix. No fibroids or other mass visualized.

Endometrium

Thickness: 5 mm.  No focal abnormality visualized.

Right ovary

Measurements: 3.3 x 1.8 x 2.4 cm. Normal appearance/no adnexal mass.

Left ovary

Measurements: 2.6 x 1.2 x 1.8 cm. Normal appearance/no adnexal mass.

Pulsed Doppler evaluation of both ovaries demonstrates normal
low-resistance arterial and venous waveforms.

Other findings

No abnormal free fluid.
IMPRESSION: Normal ultrasound appearance of the uterus and ovaries.

## 2021-10-06 NOTE — Progress Notes (Signed)
Formatting of this note is different from the original.  History  Chief Complaint   Patient presents with    Urinary Frequency     X  1 week     36 year old female presents with urinary frequency for the past week.  Prior to that she thought that she had a yeast infection, took over-the-counter Monistat for her symptoms.  She now has occasional itching, but those symptoms have for the most part alleviated.  Now she is experiencing frequency, urgency.  Denies significant dysuria.  No fevers, chills, nausea, vomiting, flank pain.  She took Azo that did improve her symptoms in the short-term.  No significant vaginal discharge.    Urinary Frequency  Associated symptoms: no fever and no nausea      No past medical history on file.    No past surgical history on file.    No family history on file.    Social History     Tobacco Use    Smoking status: Never    Smokeless tobacco: Never   Substance Use Topics    Alcohol use: Not Currently    Drug use: Not Currently     Review of Systems   Constitutional:  Negative for chills and fever.   Gastrointestinal:  Negative for nausea.   Genitourinary:  Positive for frequency and urgency. Negative for dysuria, flank pain and vaginal discharge.        Mild vaginal itching     Physical Exam  BP 117/84 (BP Location: Right arm, Patient Position: Sitting)   Pulse (!) 92   Temp 98.6 F (37 C) (Oral)   Resp 18   Wt 72.6 kg (160 lb)   LMP 09/19/2021 (Exact Date)   SpO2 97%   BMI 27.04 kg/m     Physical Exam  Constitutional:       Appearance: Normal appearance.   Abdominal:      General: Abdomen is flat.      Palpations: Abdomen is soft.      Tenderness: There is no abdominal tenderness. There is no right CVA tenderness or left CVA tenderness.   Neurological:      Mental Status: She is alert.   Psychiatric:         Mood and Affect: Mood normal.         Behavior: Behavior normal.     Ortho Exam    Care Course  Procedures    Medical Decision Making    Problem List Items Addressed This  Visit    None  Visit Diagnoses       Acute cystitis with hematuria    -  Primary    Relevant Medications    sulfamethoxazole-trimethoprim (BACTRIM DS) 800-160 mg per tablet    Urinary frequency        Relevant Orders    Urinalysis, Auto with Microscopic-POC (Completed)    Urine Microscopic - POC (Completed)    Urine Culture (Urine C&S)    Vaginal candidiasis        Relevant Medications    fluconazole (DIFLUCAN) 150 MG tablet         Patient's symptoms, physical examination, urinalysis consistent with likely urinary tract infection.  Will dose with a 3-day course of Bactrim.  Will prescribe Diflucan.  Follow-up with urine culture and adjust therapies if necessary.  If symptoms persist or worsen she will contact the clinic for further recommendations.  Patient understands and agrees with plan.    Results  Labs Reviewed  URINALYSIS, AUTO WITH MICROSCOPIC-POC - Abnormal; Notable for the following components:       Result Value    Clarity, UA Cloudy (*)     Leukocyte Esterase, UA Moderate (*)     Protein, UA >=300 (*)     Blood, UA Large (*)     All other components within normal limits   URINE MICROSCOPIC - POC - Abnormal; Notable for the following components:    WBC, UA Too Numerous to Count (*)     RBC, UA 10-20 (*)     Bacteria, UA 4+ (*)     All other components within normal limits   URINE CULTURE (URINE C&S)       Chriss Czar, MD  10/06/21 1200    Electronically signed by Chriss Czar, MD at 10/06/2021 12:00 PM EST

## 2022-09-20 ENCOUNTER — Inpatient Hospital Stay: Admit: 2022-09-20 | Discharge: 2022-09-21 | Disposition: A | Discharge status: Home / Self Care

## 2022-09-20 DIAGNOSIS — T300 Burn of unspecified body region, unspecified degree: Secondary | ICD-10-CM

## 2022-09-20 DIAGNOSIS — T2602XA Burn of left eyelid and periocular area, initial encounter: Secondary | ICD-10-CM

## 2022-09-20 NOTE — ED Provider Notes (Signed)
Emergency Department Provider Note       PCP: No, Pcp   Age: 37 y.o.   Sex: female     DISPOSITION Discharge - Pending Orders Complete 09/20/2022 08:59:47 PM       ICD-10-CM    1. Burn  T30.0           Medical Decision Making     Complexity of Problems Addressed:  1 or more acute illnesses that pose a threat to life or bodily function.     Data Reviewed and Analyzed:  I independently ordered and reviewed each unique test.  I reviewed external records: ED visit note from an outside group.   The patients assessment required an independent historian: Mom.  The reason they were needed is .        Discussion of management or test interpretation.  I discussed patient with Dr. Derinda Sis ophthalmology, he suggests Maxitrol ointment and he will see the patient at 9 in the morning.  We discussed the history, physical exam, workup and disposition.  Patient is feeling better after the tetracaine drops and the lidocaine gel here.  She was advised to use over-the-counter ibuprofen and/or Tylenol as directed and return to the ED immediately if worsening in any way.  Do not drive until she can safely see.  Note for work written.  Patient is stable for discharge and ambulatory out of the ED without difficulty at this time.      Risk of Complications and/or Morbidity of Patient Management:  Prescription drug management performed.  Discussion with external consultants.  Shared medical decision making was utilized in creating the patients health plan today.  Considerations: The following items were considered but not ordered: Further evaluation.         History       Patient was boiling an egg trying to eat healthy when she leaned over the pot and the egg exploded on her face.  It went in both of her eyes and around her eyelids.  A hot water burn to her face and the eyes.  She is here with her mother.  Last Tdap 05/06/2015.  She did ambulate to the room without difficulty and is well-hydrated.  There are no foreign bodies.  She does not  wear contacts or glasses.    The history is provided by the patient.        Review of Systems    Physical Exam     Vitals signs and nursing note reviewed:  Vitals:    09/20/22 1854   BP: (!) 138/101   Pulse: 99   Resp: 18   Temp: 97.8 F (36.6 C)   TempSrc: Oral   SpO2: 97%   Weight: 72.6 kg (160 lb)   Height: 1.626 m (5\' 4" )      Physical Exam  Vitals and nursing note reviewed.   Constitutional:       Appearance: Normal appearance.   HENT:      Head: Normocephalic and atraumatic. Right periorbital erythema and left periorbital erythema present.      Jaw: There is normal jaw occlusion.        Comments: Erythema, pain to skin above and below eyes. PERRLA, EOMI.      Right Ear: External ear normal.      Left Ear: External ear normal.      Nose: Nose normal.      Mouth/Throat:      Mouth: Mucous membranes are moist.   Eyes:  General: Lids are everted, no foreign bodies appreciated. Vision grossly intact. Gaze aligned appropriately.         Right eye: No foreign body.         Left eye: No foreign body.      Intraocular pressure: Right eye pressure is 19 mmHg. Left eye pressure is 19 mmHg. Measurements were taken using a handheld tonometer.     Extraocular Movements: Extraocular movements intact.      Conjunctiva/sclera: Conjunctivae normal.      Pupils: Pupils are equal, round, and reactive to light.      Right eye: Pupil is round and reactive. No corneal abrasion or fluorescein uptake. Seidel exam negative.      Left eye: Pupil is round and reactive. No corneal abrasion or fluorescein uptake. Seidel exam negative.  Cardiovascular:      Rate and Rhythm: Normal rate.      Pulses: Normal pulses.   Pulmonary:      Effort: Pulmonary effort is normal.   Abdominal:      General: Abdomen is flat.      Palpations: Abdomen is soft.   Musculoskeletal:         General: Normal range of motion.      Cervical back: Normal range of motion.   Skin:     General: Skin is warm.      Capillary Refill: Capillary refill takes less than  2 seconds.   Neurological:      General: No focal deficit present.      Mental Status: She is alert and oriented to person, place, and time. Mental status is at baseline.   Psychiatric:         Mood and Affect: Mood normal.         Behavior: Behavior normal.         Thought Content: Thought content normal.         Judgment: Judgment normal.          Procedures     Procedures    Orders Placed This Encounter   Procedures    Visual acuity screening        Medications given during this emergency department visit:  Medications   fluorescein ophthalmic strip 1 mg (has no administration in time range)   proparacaine (ALCAINE) 0.5 % ophthalmic solution 1 drop (has no administration in time range)   neomycin-polymyxin-dexameth ophthalmic ointment (has no administration in time range)   lidocaine (XYLOCAINE) 2 % uro-jet ( Topical Given 09/20/22 2025)       New Prescriptions    LIDOCAINE 4 % GEL    Apply 1 mL topically 4 times daily as needed (Pain)    NEOMYCIN-POLYMYXIN-DEXAMETH 0.1 % OINT    Apply 0.5 inches to eye 3 times daily for 10 days        History reviewed. No pertinent past medical history.     History reviewed. No pertinent surgical history.     Social History     Socioeconomic History    Marital status: Married     Spouse name: None    Number of children: None    Years of education: None    Highest education level: None   Tobacco Use    Smoking status: Never    Smokeless tobacco: Never   Vaping Use    Vaping Use: Never used        Previous Medications    No medications on file  No results found for any visits on 09/20/22.     No orders to display                     Voice dictation software was used during the making of this note.  This software is not perfect and grammatical and other typographical errors may be present.  This note has not been completely proofread for errors.     Joanie Coddington, [place]  09/20/22 2103

## 2022-09-20 NOTE — Discharge Instructions (Addendum)
Avoid rubbing the eyes.  Use the Maxitrol ointment 3 times daily for 10 days.  Dr. Derinda Sis wants to see you at 9 AM in the morning tomorrow for follow-up.  Use over-the-counter ibuprofen and/or Tylenol as directed if needed for pain.  Return to the ED if worsening in any way.  Do not drive until you can see well.

## 2022-09-20 NOTE — ED Notes (Signed)
I have reviewed discharge instructions with the patient.  The patient verbalized understanding.    Patient left ED via Discharge Method: ambulatory to Home with family.     Opportunity for questions and clarification provided.       2 px        To continue your aftercare when you leave the hospital, you may receive an automated call from our care team to check in on how you are doing.  This is a free service and part of our promise to provide the best care and service to meet your aftercare needs." If you have questions, or wish to unsubscribe from this service please call [phone].  Thank you for Choosing our [facility] Emergency Department.

## 2022-09-20 NOTE — ED Triage Notes (Signed)
Patient was taking an egg out of boiling water and it exploded on her face and eyes. Egg and water went in her eyes and she is having trouble opening them. This happened around 4:45 this afternoon.

## 2022-09-21 MED ORDER — FLUORESCEIN SODIUM 1 MG OP STRP
1 MG | OPHTHALMIC | Status: AC
Start: 2022-09-21 — End: 2022-09-20
  Administered 2022-09-21: 03:00:00 1 via OPHTHALMIC

## 2022-09-21 MED ORDER — NEOMYCIN-POLYMYXIN-DEXAMETH 0.1 % OP OINT
0.1 % | Freq: Three times a day (TID) | OPHTHALMIC | 0 refills | Status: AC
Start: 2022-09-21 — End: 2022-09-30

## 2022-09-21 MED ORDER — PROPARACAINE HCL 0.5 % OP SOLN
0.5 % | OPHTHALMIC | Status: DC
Start: 2022-09-21 — End: 2022-09-21

## 2022-09-21 MED ORDER — LIDOCAINE 4 % EX GEL
4 % | Freq: Four times a day (QID) | CUTANEOUS | 0 refills | Status: AC | PRN
Start: 2022-09-21 — End: ?

## 2022-09-21 MED ORDER — LIDOCAINE HCL URETHRAL/MUCOSAL 2 % EX PRSY
2 | Freq: Once | CUTANEOUS | Status: AC
Start: 2022-09-21 — End: 2022-09-20
  Administered 2022-09-21: 01:00:00 via TOPICAL

## 2022-09-21 MED ORDER — NEOMYCIN-POLYMYXIN-DEXAMETH 3.5-10000-0.1 OP OINT
Freq: Three times a day (TID) | OPHTHALMIC | Status: DC
Start: 2022-09-21 — End: 2022-09-21

## 2022-09-21 MED FILL — PROPARACAINE HCL 0.5 % OP SOLN: 0.5 % | OPHTHALMIC | Qty: 15

## 2022-09-21 MED FILL — BIO GLO 1 MG OP STRP: 1 MG | OPHTHALMIC | Qty: 1

## 2022-09-21 MED FILL — LIDOCAINE HCL URETHRAL/MUCOSAL 2 % EX PRSY: 2 % | CUTANEOUS | Qty: 10

## 2022-09-21 MED FILL — NEOMYCIN-POLYMYXIN-DEXAMETH 3.5-10000-0.1 OP OINT: OPHTHALMIC | Qty: 3.5
